# Patient Record
Sex: Female | Born: 1955 | Race: Black or African American | Hispanic: No | Marital: Single | Smoking: Current every day smoker
Health system: Southern US, Community
[De-identification: ages and names within clinical notes are randomized; demographics above are authoritative.]

## PROBLEM LIST (undated history)

## (undated) DIAGNOSIS — I10 Essential (primary) hypertension: Secondary | ICD-10-CM

## (undated) HISTORY — PX: UTERINE FIBROID EMBOLIZATION: SHX825

---

## 2016-04-18 ENCOUNTER — Encounter (HOSPITAL_BASED_OUTPATIENT_CLINIC_OR_DEPARTMENT_OTHER): Payer: Self-pay

## 2016-04-18 ENCOUNTER — Emergency Department (HOSPITAL_BASED_OUTPATIENT_CLINIC_OR_DEPARTMENT_OTHER)
Admission: EM | Admit: 2016-04-18 | Discharge: 2016-04-18 | Disposition: A | Discharge status: Home / Self Care | Payer: Medicaid Other | Attending: Emergency Medicine | Admitting: Emergency Medicine

## 2016-04-18 ENCOUNTER — Emergency Department (HOSPITAL_BASED_OUTPATIENT_CLINIC_OR_DEPARTMENT_OTHER): Payer: Medicaid Other

## 2016-04-18 DIAGNOSIS — Z79899 Other long term (current) drug therapy: Secondary | ICD-10-CM | POA: Insufficient documentation

## 2016-04-18 DIAGNOSIS — F17200 Nicotine dependence, unspecified, uncomplicated: Secondary | ICD-10-CM | POA: Insufficient documentation

## 2016-04-18 DIAGNOSIS — R51 Headache: Secondary | ICD-10-CM | POA: Insufficient documentation

## 2016-04-18 DIAGNOSIS — I10 Essential (primary) hypertension: Secondary | ICD-10-CM | POA: Diagnosis present

## 2016-04-18 HISTORY — DX: Essential (primary) hypertension: I10

## 2016-04-18 LAB — URINALYSIS, ROUTINE W REFLEX MICROSCOPIC
BILIRUBIN URINE: NEGATIVE
GLUCOSE, UA: NEGATIVE mg/dL
KETONES UR: NEGATIVE mg/dL
Nitrite: NEGATIVE
PH: 7 (ref 5.0–8.0)
Protein, ur: NEGATIVE mg/dL
SPECIFIC GRAVITY, URINE: 1.009 (ref 1.005–1.030)

## 2016-04-18 LAB — CBC WITH DIFFERENTIAL/PLATELET
Basophils Absolute: 0 10*3/uL (ref 0.0–0.1)
Basophils Relative: 0 %
Eosinophils Absolute: 0 10*3/uL (ref 0.0–0.7)
Eosinophils Relative: 0 %
HEMATOCRIT: 41.6 % (ref 36.0–46.0)
HEMOGLOBIN: 14.5 g/dL (ref 12.0–15.0)
LYMPHS ABS: 1.8 10*3/uL (ref 0.7–4.0)
LYMPHS PCT: 33 %
MCH: 34.9 pg — AB (ref 26.0–34.0)
MCHC: 34.9 g/dL (ref 30.0–36.0)
MCV: 100.2 fL — AB (ref 78.0–100.0)
MONO ABS: 0.5 10*3/uL (ref 0.1–1.0)
MONOS PCT: 9 %
NEUTROS ABS: 3.1 10*3/uL (ref 1.7–7.7)
NEUTROS PCT: 58 %
Platelets: 240 10*3/uL (ref 150–400)
RBC: 4.15 MIL/uL (ref 3.87–5.11)
RDW: 12.5 % (ref 11.5–15.5)
WBC: 5.4 10*3/uL (ref 4.0–10.5)

## 2016-04-18 LAB — URINE MICROSCOPIC-ADD ON

## 2016-04-18 LAB — BASIC METABOLIC PANEL
Anion gap: 8 (ref 5–15)
BUN: 9 mg/dL (ref 6–20)
CO2: 29 mmol/L (ref 22–32)
Calcium: 9.7 mg/dL (ref 8.9–10.3)
Chloride: 103 mmol/L (ref 101–111)
Creatinine, Ser: 0.73 mg/dL (ref 0.44–1.00)
GFR calc Af Amer: >60 mL/min (ref >60)
GLUCOSE: 116 mg/dL — AB (ref 65–99)
POTASSIUM: 3.5 mmol/L (ref 3.5–5.1)
Sodium: 140 mmol/L (ref 135–145)

## 2016-04-18 LAB — TROPONIN I: Troponin I: <0.03 ng/mL (ref <0.03)

## 2016-04-18 MED ORDER — LISINOPRIL 10 MG PO TABS
20.0000 mg | ORAL_TABLET | Freq: Once | ORAL | Status: AC
  Administered 2016-04-18: 20 mg via ORAL
  Filled 2016-04-18: qty 2

## 2016-04-18 MED ORDER — LISINOPRIL 40 MG PO TABS: 40.0000 mg | ORAL_TABLET | Freq: Every day | ORAL | 0 refills | Status: AC

## 2016-04-18 MED ORDER — HYDROCHLOROTHIAZIDE 25 MG PO TABS: 25.0000 mg | ORAL_TABLET | Freq: Every day | ORAL | 0 refills | Status: AC

## 2016-04-18 MED ORDER — ACETAMINOPHEN 500 MG PO TABS
1000.0000 mg | ORAL_TABLET | Freq: Once | ORAL | Status: AC
  Administered 2016-04-18: 1000 mg via ORAL
  Filled 2016-04-18: qty 2

## 2016-04-18 MED ORDER — HYDRALAZINE HCL 20 MG/ML IJ SOLN: 10.0000 mg | Freq: Once | INTRAMUSCULAR | Status: DC

## 2016-04-18 NOTE — ED Triage Notes (Signed)
C/o elevated BP over the last month-hx of HTN-she was seen by PCP approx one month ago-no changes were made at that time-pt did not f/u-she called to day and was advised to come to ED-NAD

## 2016-04-18 NOTE — Discharge Instructions (Signed)
Increase your lisinopril dose to 40 mg daily. Increase your hydrochlorothiazide dose to 25 mg daily. You may take two of your already prescribed lisinopril 20/12.5 tablets in the mornings. A new prescription for lisinopril 40/hctz 25 to start if you run out of your other medication before you see your doctor again.

## 2016-04-18 NOTE — ED Provider Notes (Signed)
[facility]   CSN: [id number] Arrival date & time: 04/18/16  [id number]  First Provider Contact:   First MD Initiated Contact with Patient 04/18/16 1846      By signing my name below, I, Soijett Blue, attest that this documentation has been prepared under the direction and in the presence of Arby BarretteMarcy Roena Sassaman, MD. Electronically Signed: Soijett Blue, ED Scribe. 04/18/16. 6:55 PM.    History   Chief Complaint Chief Complaint  Patient presents with  . Hypertension    HPI  Misty Contreras is a 60 y.o. female with a PMHx of HTN, who presents to the Emergency Department complaining of HTN. Pt states that [place] 3 [place] prior to the onset of her symptoms. Pt notes that [place] and she was last seen by her PCP one month ago and was informed to follow up for a HTN medication change, to which she didn't. Pt states that she didn't follow up due due to her mother having a procedure being completed in [place]. Pt notes that she called her PCP office today and was informed to come into the ED for further evaluation of her symptoms. Pt reports that her blood pressure is typically 130 as the top number. Pt states that she typically takes lisinopril-HCTZ 20-12.5 mg that she takes in the morning. Pt denies missing any recent doses and she denies taking any other medications for her symptoms.   She states that she is having associated symptoms of sudden onset posterior HA. She states that she has not tried any medications for the relief for her symptoms. She denies vision change, weakness, numbness, tingling, SOB, CP, leg swelling, and any other symptoms. Pt notes that she does smoke 0.5 PPD of cigarettes daily Pt endorses drinking ETOH (wine) ever other day. Denies hx of strokes or recent surgical procedures.    The history is provided by the patient. No language interpreter was used.    Past Medical  History:  Diagnosis Date  . Hypertension     There are no active problems to display for this patient.   Past Surgical History:  Procedure Laterality Date  . UTERINE FIBROID EMBOLIZATION      OB History    No data available       Home Medications    Prior to Admission medications   Medication Sig Start Date End Date Taking? Authorizing Provider  lisinopril-hydrochlorothiazide (PRINZIDE,ZESTORETIC) 20-12.5 MG tablet Take 1 tablet by mouth daily.   Yes Historical Provider, MD  hydrochlorothiazide (HYDRODIURIL) 25 MG tablet Take 1 tablet (25 mg total) by mouth daily. 04/18/16   Arby BarretteMarcy Tripp Goins, MD  lisinopril (PRINIVIL,ZESTRIL) 40 MG tablet Take 1 tablet (40 mg total) by mouth daily. 04/18/16   Arby BarretteMarcy Cyndel Griffey, MD    Family History No family history on file.  Social History Social History  Substance Use Topics  . Smoking status: Current Every Day Smoker  . Smokeless tobacco: Never Used  . Alcohol use Yes     Comment: weekly     Allergies   Review of patient's allergies indicates no known allergies.   Review of Systems Review of Systems A complete 10 system review of systems was obtained and all systems are negative except as noted in the HPI and PMH.    Physical Exam Updated Vital Signs BP 181/92   Pulse 62   Temp 98.2 F (36.8 C) (Oral)  Resp 19   Ht  (1.727 m)   Wt 125 lb (56.7 kg)   SpO2 99%   BMI 19.01 kg/m   Physical Exam  Constitutional: She is oriented to person, place, and time. She appears well-developed and well-nourished. No distress.  HENT:  Head: Normocephalic and atraumatic.  Mouth/Throat: Oropharynx is clear and moist. No oropharyngeal exudate.  Eyes: EOM are normal. Pupils are equal, round, and reactive to light.  Neck: Normal range of motion. Neck supple. No thyromegaly present.  Cardiovascular: Normal rate, regular rhythm and normal heart sounds.  Exam reveals no gallop and no friction rub.   No murmur heard. Pulmonary/Chest:  Effort normal and breath sounds normal. No respiratory distress. She has no wheezes. She has no rales.  Abdominal: She exhibits no distension.  Musculoskeletal: Normal range of motion.  Neurological: She is alert and oriented to person, place, and time.  Skin: Skin is warm and dry.  Psychiatric: She has a normal mood and affect. Her behavior is normal.  Nursing note and vitals reviewed.    ED Treatments / Results  DIAGNOSTIC STUDIES: Oxygen Saturation is 100% on RA, nl by my interpretation.    COORDINATION OF CARE: 6:55 PM Discussed treatment plan with pt at bedside which includes labs, UA, and pt agreed to plan.   Labs (all labs ordered are listed, but only abnormal results are displayed) Labs Reviewed  BASIC METABOLIC PANEL - Abnormal; Notable for the following:       Result Value   Glucose, Bld 116 (*)    All other components within normal limits  CBC WITH DIFFERENTIAL/PLATELET - Abnormal; Notable for the following:    MCV 100.2 (*)    MCH 34.9 (*)    All other components within normal limits  TROPONIN I  URINALYSIS, ROUTINE W REFLEX MICROSCOPIC (NOT AT [facility])    EKG  EKG Interpretation  Date/Time:  Wednesday April 18 2016 19:07:19 EDT Ventricular Rate:  67 PR Interval:    QRS Duration: 95 QT Interval:  429 QTC Calculation: 453 R Axis:   -52 Text Interpretation:  Sinus rhythm Probable left atrial enlargement Left anterior fascicular block RSR' in V1 or V2, probably normal variant Consider anterior infarct agree. no STEMI Confirmed by Donnald Garre, MD, Lebron Conners [phone]) on 04/18/2016 7:43:29 PM       Radiology Ct Head Wo Contrast  Result Date: 04/18/2016 CLINICAL DATA:  60 year old female with acute headache and hypertension. EXAM: CT HEAD WITHOUT CONTRAST TECHNIQUE: Contiguous axial images were obtained from the base of the skull through the vertex without intravenous contrast. COMPARISON:  None. FINDINGS: Mild generalized cerebral volume loss noted. No acute intracranial  abnormalities are identified, including mass lesion or mass effect, hydrocephalus, extra-axial fluid collection, midline shift, hemorrhage, or acute infarction. The visualized bony calvarium is unremarkable. IMPRESSION: No evidence of acute intracranial abnormality. Electronically Signed   By: Harmon Pier M.D.   On: 04/18/2016 19:25    Procedures Procedures (including critical care time)  Medications Ordered in ED Medications  hydrALAZINE (APRESOLINE) injection 10 mg (not administered)  lisinopril (PRINIVIL,ZESTRIL) tablet 20 mg (20 mg Oral Given 04/18/16 1920)  acetaminophen (TYLENOL) tablet 1,000 mg (1,000 mg Oral Given 04/18/16 1920)     Initial Impression / Assessment and Plan / ED Course  I have reviewed the triage vital signs and the nursing notes.  Pertinent labs & imaging results that were available during my care of the patient were reviewed by me and considered in my medical decision making (see  chart for details).  Clinical Course    Final Clinical Impressions(s) / ED Diagnoses   Final diagnoses:  Essential hypertension  Patient's headache had nearly resolved at arrival to the emergency department. There are no associated signs of encephalopathy. No neurologic dysfunction. No chest pain. CT head is negative and diagnostic workup otherwise within normal limits. At this time I do feel patient is safe for continued outpatient management. Her lisinopril dose will be increased to 40 mg daily and her hydrochlorothiazide dose to 25 mg daily. Patient is encouraged have close follow-up with her PCP but she is also advised to return to the emergency department if she should develop any concerning symptoms.  New Prescriptions New Prescriptions   HYDROCHLOROTHIAZIDE (HYDRODIURIL) 25 MG TABLET    Take 1 tablet (25 mg total) by mouth daily.   LISINOPRIL (PRINIVIL,ZESTRIL) 40 MG TABLET    Take 1 tablet (40 mg total) by mouth daily.       Arby Barrette, MD 04/18/16 [phone]

## 2016-04-18 NOTE — ED Notes (Signed)
Pt states she's been feeling weak and checked her BP and it was high.  Has been taking her BP meds and states her dr is trying to change it but having a hard time to get in to see him.

## 2016-04-18 NOTE — ED Notes (Signed)
Patient transported to CT 

## 2016-04-19 ENCOUNTER — Encounter (HOSPITAL_BASED_OUTPATIENT_CLINIC_OR_DEPARTMENT_OTHER): Payer: Self-pay | Admitting: Emergency Medicine

## 2016-04-19 ENCOUNTER — Emergency Department (HOSPITAL_BASED_OUTPATIENT_CLINIC_OR_DEPARTMENT_OTHER)
Admission: EM | Admit: 2016-04-19 | Discharge: 2016-04-19 | Disposition: A | Discharge status: Home / Self Care | Payer: Medicaid Other | Attending: Emergency Medicine | Admitting: Emergency Medicine

## 2016-04-19 DIAGNOSIS — F17210 Nicotine dependence, cigarettes, uncomplicated: Secondary | ICD-10-CM | POA: Insufficient documentation

## 2016-04-19 DIAGNOSIS — Z79899 Other long term (current) drug therapy: Secondary | ICD-10-CM | POA: Diagnosis not present

## 2016-04-19 DIAGNOSIS — I10 Essential (primary) hypertension: Secondary | ICD-10-CM | POA: Diagnosis present

## 2016-04-19 NOTE — ED Provider Notes (Signed)
[facility]   CSN: [id number] Arrival date & time: 04/19/16  0202  First Provider Contact:  None       History   Chief Complaint Chief Complaint  Patient presents with  . Hypertension    HPI Misty Contreras is a 60 y.o. female with a history of hypertension. She was seen here yesterday after becoming concerned about her blood pressure. Workup was unremarkable apart from moderately elevated blood pressure and she was prescribed increased doses of her lisinopril and hydrochlorothiazide. She returns because she took her blood pressure at home this morning and it was 204/120. She became very anxious and called 911. She had a mild headache initially but this has resolved. She denies chest pain or shortness of breath.  HPI  Past Medical History:  Diagnosis Date  . Hypertension     There are no active problems to display for this patient.   Past Surgical History:  Procedure Laterality Date  . UTERINE FIBROID EMBOLIZATION      OB History    No data available       Home Medications    Prior to Admission medications   Medication Sig Start Date End Date Taking? Authorizing Provider  hydrochlorothiazide (HYDRODIURIL) 25 MG tablet Take 1 tablet (25 mg total) by mouth daily. 04/18/16   Arby Barrette, MD  lisinopril (PRINIVIL,ZESTRIL) 40 MG tablet Take 1 tablet (40 mg total) by mouth daily. 04/18/16   Arby Barrette, MD  lisinopril-hydrochlorothiazide (PRINZIDE,ZESTORETIC) 20-12.5 MG tablet Take 1 tablet by mouth daily.    Historical Provider, MD    Family History History reviewed. No pertinent family history.  Social History Social History  Substance Use Topics  . Smoking status: Current Every Day Smoker  . Smokeless tobacco: Never Used  . Alcohol use Yes     Comment: weekly     Allergies   Review of patient's allergies indicates no known allergies.   Review of Systems Review of Systems  All other systems reviewed and are  negative.    Physical Exam Updated Vital Signs BP 183/94   Pulse (!) 58   Temp 98.2 F (36.8 C) (Oral)   Resp 18   SpO2 100%   Physical Exam General: Well-developed, well-nourished female in no acute distress; appearance consistent with age of record HENT: normocephalic; atraumatic Eyes: pupils equal, round and reactive to light; extraocular muscles intact; arcus senilis bilaterally Neck: supple Heart: regular rate and rhythm; no murmurs, rubs or gallops Lungs: clear to auscultation bilaterally Abdomen: soft; nondistended; nontender; no masses or hepatosplenomegaly; bowel sounds present Extremities: No deformity; full range of motion; pulses normal Neurologic: Awake, alert and oriented; motor function intact in all extremities and symmetric; no facial droop; normal speech; normal gait; normal coordination Skin: Warm and dry Psychiatric: Normal mood and affect    ED Treatments / Results   Nursing notes and vitals signs, including pulse oximetry, reviewed.  Summary of this visit's results, reviewed by myself:  Labs:  Results for orders placed or performed during the hospital encounter of 04/18/16 (from the past 24 hour(s))  Basic metabolic panel     Status: Abnormal   Collection Time: 04/18/16  7:08 PM  Result Value Ref Range   Sodium 140 135 - 145 mmol/L   Potassium 3.5 3.5 - 5.1 mmol/L   Chloride 103 101 - 111 mmol/L   CO2 29 22 - 32 mmol/L   Glucose, Bld 116 (H) 65 - 99 mg/dL   BUN 9 6 - 20  mg/dL   Creatinine, Ser 1.61 0.44 - 1.00 mg/dL   Calcium 9.7 8.9 - 09.6 mg/dL   GFR calc non Af Amer >60 >60 mL/min   GFR calc Af Amer >60 >60 mL/min   Anion gap 8 5 - 15  Troponin I     Status: None   Collection Time: 04/18/16  7:08 PM  Result Value Ref Range   Troponin I <0.03 <0.03 ng/mL  CBC with Differential     Status: Abnormal   Collection Time: 04/18/16  7:08 PM  Result Value Ref Range   WBC 5.4 4.0 - 10.5 K/uL   RBC 4.15 3.87 - 5.11 MIL/uL   Hemoglobin 14.5 12.0  - 15.0 g/dL   HCT 04.5 40.9 - 81.1 %   MCV 100.2 (H) 78.0 - 100.0 fL   MCH 34.9 (H) 26.0 - 34.0 pg   MCHC 34.9 30.0 - 36.0 g/dL   RDW 91.4 78.2 - 95.6 %   Platelets 240 150 - 400 K/uL   Neutrophils Relative % 58 %   Neutro Abs 3.1 1.7 - 7.7 K/uL   Lymphocytes Relative 33 %   Lymphs Abs 1.8 0.7 - 4.0 K/uL   Monocytes Relative 9 %   Monocytes Absolute 0.5 0.1 - 1.0 K/uL   Eosinophils Relative 0 %   Eosinophils Absolute 0.0 0.0 - 0.7 K/uL   Basophils Relative 0 %   Basophils Absolute 0.0 0.0 - 0.1 K/uL  Urinalysis, Routine w reflex microscopic     Status: Abnormal   Collection Time: 04/18/16  8:40 PM  Result Value Ref Range   Color, Urine YELLOW YELLOW   APPearance CLEAR CLEAR   Specific Gravity, Urine 1.009 1.005 - 1.030   pH 7.0 5.0 - 8.0   Glucose, UA NEGATIVE NEGATIVE mg/dL   Hgb urine dipstick MODERATE (A) NEGATIVE   Bilirubin Urine NEGATIVE NEGATIVE   Ketones, ur NEGATIVE NEGATIVE mg/dL   Protein, ur NEGATIVE NEGATIVE mg/dL   Nitrite NEGATIVE NEGATIVE   Leukocytes, UA MODERATE (A) NEGATIVE  Urine microscopic-add on     Status: Abnormal   Collection Time: 04/18/16  8:40 PM  Result Value Ref Range   Squamous Epithelial / LPF 0-5 (A) NONE SEEN   WBC, UA 0-5 0 - 5 WBC/hpf   RBC / HPF 0-5 0 - 5 RBC/hpf   Bacteria, UA FEW (A) NONE SEEN    Imaging Studies: Ct Head Wo Contrast  Result Date: 04/18/2016 CLINICAL DATA:  60 year old female with acute headache and hypertension. EXAM: CT HEAD WITHOUT CONTRAST TECHNIQUE: Contiguous axial images were obtained from the base of the skull through the vertex without intravenous contrast. COMPARISON:  None. FINDINGS: Mild generalized cerebral volume loss noted. No acute intracranial abnormalities are identified, including mass lesion or mass effect, hydrocephalus, extra-axial fluid collection, midline shift, hemorrhage, or acute infarction. The visualized bony calvarium is unremarkable. IMPRESSION: No evidence of acute intracranial  abnormality. Electronically Signed   By: Harmon Pier M.D.   On: 04/18/2016 19:25    Procedures (including critical care time)  Final Clinical Impressions(s) / ED Diagnoses  The patient was advised that her blood pressure has improved and was 178/94 on arrival. There is no indication for emergent intervention she has no evidence of end organ damage. She was advised that worrying about her blood pressure can in fact raise her blood pressure and can become a vicious cycle. She was advised to take her blood pressure at a given time each day and keep a log.  Final diagnoses:  Hypertension not at goal      Paula LibraJohn Percy Winterrowd, MD 04/19/16 [phone]

## 2016-04-19 NOTE — ED Notes (Signed)
Pt reports mild numbness left upper are denies radiation denies numbness elsewhere.

## 2016-04-19 NOTE — ED Notes (Signed)
MD at bedside. 

## 2016-04-30 ENCOUNTER — Encounter (HOSPITAL_BASED_OUTPATIENT_CLINIC_OR_DEPARTMENT_OTHER): Payer: Self-pay | Admitting: *Deleted

## 2016-04-30 ENCOUNTER — Emergency Department (HOSPITAL_BASED_OUTPATIENT_CLINIC_OR_DEPARTMENT_OTHER)
Admission: EM | Admit: 2016-04-30 | Discharge: 2016-05-01 | Disposition: A | Discharge status: Home / Self Care | Payer: Medicaid Other | Attending: Emergency Medicine | Admitting: Emergency Medicine

## 2016-04-30 DIAGNOSIS — F17200 Nicotine dependence, unspecified, uncomplicated: Secondary | ICD-10-CM | POA: Insufficient documentation

## 2016-04-30 DIAGNOSIS — N898 Other specified noninflammatory disorders of vagina: Secondary | ICD-10-CM

## 2016-04-30 DIAGNOSIS — H9209 Otalgia, unspecified ear: Secondary | ICD-10-CM | POA: Insufficient documentation

## 2016-04-30 DIAGNOSIS — I10 Essential (primary) hypertension: Secondary | ICD-10-CM | POA: Diagnosis not present

## 2016-04-30 DIAGNOSIS — Z711 Person with feared health complaint in whom no diagnosis is made: Secondary | ICD-10-CM

## 2016-04-30 DIAGNOSIS — J309 Allergic rhinitis, unspecified: Secondary | ICD-10-CM | POA: Diagnosis not present

## 2016-04-30 DIAGNOSIS — Z79899 Other long term (current) drug therapy: Secondary | ICD-10-CM | POA: Diagnosis not present

## 2016-04-30 LAB — URINE MICROSCOPIC-ADD ON

## 2016-04-30 LAB — WET PREP, GENITAL
CLUE CELLS WET PREP: NONE SEEN
Sperm: NONE SEEN
Trich, Wet Prep: NONE SEEN
YEAST WET PREP: NONE SEEN

## 2016-04-30 LAB — URINALYSIS, ROUTINE W REFLEX MICROSCOPIC
Bilirubin Urine: NEGATIVE
Glucose, UA: NEGATIVE mg/dL
Ketones, ur: NEGATIVE mg/dL
Nitrite: NEGATIVE
PROTEIN: NEGATIVE mg/dL
Specific Gravity, Urine: 1.003 — ABNORMAL LOW (ref 1.005–1.030)
pH: 7 (ref 5.0–8.0)

## 2016-04-30 NOTE — ED Notes (Signed)
Pelvic cart outside pts room. 

## 2016-04-30 NOTE — ED Triage Notes (Signed)
Pt c/o vaginal discharge x 1 week.  

## 2016-04-30 NOTE — ED Provider Notes (Signed)
[facility] Provider Note   CSN: [id number] Arrival date & time: 04/30/16  2237  By signing my name below, I, Vista Minkobert Ross, attest that this documentation has been prepared under the direction and in the presence of Will Vlasta Baskin PA-C.  Electronically Signed: Vista Minkobert Ross, ED Scribe. 04/30/16. 11:27 PM.  History   Chief Complaint Chief Complaint  Patient presents with  . Vaginal Discharge    HPI HPI Comments: Misty Contreras is a 60 y.o. female who presents to the Emergency Department complaining of white vaginal discharge that has been persistent for the past week. Pt is here visiting her mother and states that she called her PCP who told her to use Flagyl cream, but reports no relief. Pt describes the discharge as "cottage cheese". Pt was last sexually active 3 years ago. Patient also complains of nasal congestion, sneezing, sinus headache and some ear pressure. Pt does not take birth control. Pt no longer has menstrual cycles. She denies any fever, nausea, vomiting, diarrhea, vaginal bleeding, increased urinary frequency, urgency or dysuria.   The history is provided by the patient. No language interpreter was used.    Past Medical History:  Diagnosis Date  . Hypertension     There are no active problems to display for this patient.   Past Surgical History:  Procedure Laterality Date  . UTERINE FIBROID EMBOLIZATION      OB History    No data available       Home Medications    Prior to Admission medications   Medication Sig Start Date End Date Taking? Authorizing Provider  cetirizine (ZYRTEC ALLERGY) 10 MG tablet Take 1 tablet (10 mg total) by mouth daily. 05/01/16   Everlene FarrierWilliam Chimene Salo, PA-C  fluticasone (FLONASE) 50 MCG/ACT nasal spray Place 2 sprays into both nostrils daily. 05/01/16   Everlene FarrierWilliam Usama Harkless, PA-C  hydrochlorothiazide (HYDRODIURIL) 25 MG tablet Take 1 tablet (25 mg total) by mouth daily. 04/18/16   Arby BarretteMarcy Pfeiffer, MD  lisinopril (PRINIVIL,ZESTRIL) 40  MG tablet Take 1 tablet (40 mg total) by mouth daily. 04/18/16   Arby BarretteMarcy Pfeiffer, MD  lisinopril-hydrochlorothiazide (PRINZIDE,ZESTORETIC) 20-12.5 MG tablet Take 1 tablet by mouth daily.    Historical Provider, MD  metroNIDAZOLE (FLAGYL) 500 MG tablet Take 1 tablet (500 mg total) by mouth 2 (two) times daily. 05/01/16   Everlene FarrierWilliam Merin Borjon, PA-C    Family History History reviewed. No pertinent family history.  Social History Social History  Substance Use Topics  . Smoking status: Current Every Day Smoker  . Smokeless tobacco: Never Used  . Alcohol use Yes     Comment: weekly     Allergies   Review of patient's allergies indicates no known allergies.   Review of Systems Review of Systems  Constitutional: Negative for chills and fever.  HENT: Positive for ear pain, rhinorrhea and sinus pressure. Negative for congestion and sore throat.   Eyes: Negative for visual disturbance.  Respiratory: Negative for cough, shortness of breath and wheezing.   Cardiovascular: Negative for chest pain and palpitations.  Gastrointestinal: Negative for abdominal pain, diarrhea, nausea and vomiting.  Genitourinary: Positive for vaginal discharge (white). Negative for decreased urine volume, dysuria, frequency, menstrual problem, pelvic pain, urgency, vaginal bleeding and vaginal pain.  Musculoskeletal: Negative for back pain and neck pain.  Skin: Negative for rash.  Neurological: Negative for headaches.     Physical Exam Updated Vital Signs BP 162/89 (BP Location: Left Arm)   Pulse 63   Temp 98.2 F (36.8 C) (Oral)   Resp  18   Ht 5\' 8"  (1.727 m)   Wt 56.7 kg   SpO2 100%   BMI 19.01 kg/m   Physical Exam  Constitutional: She appears well-developed and well-nourished. No distress.  Nontoxic appearing.  HENT:  Head: Normocephalic and atraumatic.  Right Ear: External ear normal.  Left Ear: External ear normal.  Mouth/Throat: Oropharynx is clear and moist. No oropharyngeal exudate.  Boggy nasal  turbinates bilaterally Mild middle ear effusion bilaterally. No TM erythema or loss of landmarks. Throat is clear. No tonsillar hypertrophy or exudates.  Eyes: Conjunctivae are normal. Pupils are equal, round, and reactive to light. Right eye exhibits no discharge. Left eye exhibits no discharge.  Neck: Neck supple. No JVD present.  Cardiovascular: Normal rate, regular rhythm, normal heart sounds and intact distal pulses.   Pulmonary/Chest: Effort normal and breath sounds normal. No stridor. No respiratory distress. She has no wheezes.  Abdominal: Soft. There is no tenderness. There is no guarding.  Abdomen is soft and nontender to palpation.  Genitourinary: Vaginal discharge found.  Genitourinary Comments: Pelvic exam performed by me with female nurse as chaperone. No external lesions or rashes noted. Patient with white vaginal discharge. Cervix is closed. No cervical motion tenderness. No adnexal tenderness or fullness. No vaginal bleeding.  Lymphadenopathy:    She has no cervical adenopathy.  Neurological: She is alert. Coordination normal.  Skin: Skin is warm and dry. Capillary refill takes less than 2 seconds. No rash noted. She is not diaphoretic. No erythema. No pallor.  Psychiatric: She has a normal mood and affect. Her behavior is normal.  Nursing note and vitals reviewed.    ED Treatments / Results  DIAGNOSTIC STUDIES: Oxygen Saturation is 100% on RA, normal by my interpretation.  COORDINATION OF CARE: 11:21 PM-Will do pelvic exam. Discussed treatment plan with pt at bedside and pt agreed to plan.   Labs (all labs ordered are listed, but only abnormal results are displayed) Labs Reviewed  WET PREP, GENITAL - Abnormal; Notable for the following:       Result Value   WBC, Wet Prep HPF POC MODERATE (*)    All other components within normal limits  URINALYSIS, ROUTINE W REFLEX MICROSCOPIC (NOT AT [facility]) - Abnormal; Notable for the following:    Specific Gravity, Urine 1.003  (*)    Hgb urine dipstick MODERATE (*)    Leukocytes, UA MODERATE (*)    All other components within normal limits  URINE MICROSCOPIC-ADD ON - Abnormal; Notable for the following:    Squamous Epithelial / LPF 0-5 (*)    Bacteria, UA FEW (*)    All other components within normal limits  GC/CHLAMYDIA PROBE AMP ([place]) NOT AT [facility]    EKG  EKG Interpretation None       Radiology No results found.  Procedures Procedures (including critical care time)  Medications Ordered in ED Medications  cefTRIAXone (ROCEPHIN) injection 250 mg (250 mg Intramuscular Given 05/01/16 0021)  azithromycin (ZITHROMAX) tablet 1,000 mg (1,000 mg Oral Given 05/01/16 0022)  fluconazole (DIFLUCAN) tablet 150 mg (150 mg Oral Given 05/01/16 0021)     Initial Impression / Assessment and Plan / ED Course  I have reviewed the triage vital signs and the nursing notes.  Pertinent labs & imaging results that were available during my care of the patient were reviewed by me and considered in my medical decision making (see chart for details).  Clinical Course   Patient presented with vaginal discharge for 1 week. No abdominal  pelvic pain. On exam she is afebrile and nontoxic appearing. Her abdomen is soft and nontender. She is white vaginal discharge on exam. No cervical motion tenderness. No adnexal tenderness or fullness. Wet prep returned with many white blood cells. Patient reports she's not been sexually active for more than 3 years. Will still cover with Rocephin and azithromycin. Patient is also requesting Diflucan as she reports this helps her symptoms. Will provide her with Diflucan. I advised if her symptoms persist she can use Flagyl which I provided her prescription for discharge. I advised the patient to follow-up with their primary care provider this week. I advised the patient to return to the emergency department with new or worsening symptoms or new concerns. The patient verbalized understanding and  agreement with plan.    Final Clinical Impressions(s) / ED Diagnoses   Final diagnoses:  Vaginal discharge  Concern about STD in female without diagnosis  Allergic rhinitis, unspecified allergic rhinitis type    New Prescriptions Discharge Medication List as of 05/01/2016 12:23 AM    START taking these medications   Details  metroNIDAZOLE (FLAGYL) 500 MG tablet Take 1 tablet (500 mg total) by mouth 2 (two) times daily., Starting Tue 05/01/2016, Print       I personally performed the services described in this documentation, which was scribed in my presence. The recorded information has been reviewed and is accurate.       Everlene Farrier, PA-C 05/01/16 0041    Azalia Bilis, MD 05/02/16 [phone]

## 2016-05-01 MED ORDER — METRONIDAZOLE 500 MG PO TABS: 500.0000 mg | ORAL_TABLET | Freq: Two times a day (BID) | ORAL | 0 refills | Status: AC

## 2016-05-01 MED ORDER — CETIRIZINE HCL 10 MG PO TABS: 10.0000 mg | ORAL_TABLET | Freq: Every day | ORAL | 1 refills | Status: AC

## 2016-05-01 MED ORDER — AZITHROMYCIN 250 MG PO TABS
1000.0000 mg | ORAL_TABLET | Freq: Once | ORAL | Status: AC
  Administered 2016-05-01: 1000 mg via ORAL
  Filled 2016-05-01: qty 4

## 2016-05-01 MED ORDER — CEFTRIAXONE SODIUM 250 MG IJ SOLR
250.0000 mg | Freq: Once | INTRAMUSCULAR | Status: AC
  Administered 2016-05-01: 250 mg via INTRAMUSCULAR
  Filled 2016-05-01: qty 250

## 2016-05-01 MED ORDER — FLUCONAZOLE 100 MG PO TABS
150.0000 mg | ORAL_TABLET | Freq: Once | ORAL | Status: AC
  Administered 2016-05-01: 150 mg via ORAL
  Filled 2016-05-01: qty 1

## 2016-05-01 MED ORDER — FLUTICASONE PROPIONATE 50 MCG/ACT NA SUSP: 2.0000 | Freq: Every day | NASAL | 0 refills | Status: AC

## 2016-05-02 LAB — GC/CHLAMYDIA PROBE AMP (~~LOC~~) NOT AT ARMC
Chlamydia: NEGATIVE
NEISSERIA GONORRHEA: NEGATIVE

## 2016-07-03 ENCOUNTER — Encounter (HOSPITAL_BASED_OUTPATIENT_CLINIC_OR_DEPARTMENT_OTHER): Payer: Self-pay | Admitting: Emergency Medicine

## 2016-07-03 ENCOUNTER — Emergency Department (HOSPITAL_BASED_OUTPATIENT_CLINIC_OR_DEPARTMENT_OTHER)
Admission: EM | Admit: 2016-07-03 | Discharge: 2016-07-03 | Disposition: A | Discharge status: Home / Self Care | Payer: Medicaid Other | Attending: Emergency Medicine | Admitting: Emergency Medicine

## 2016-07-03 DIAGNOSIS — N898 Other specified noninflammatory disorders of vagina: Secondary | ICD-10-CM | POA: Insufficient documentation

## 2016-07-03 DIAGNOSIS — Z79899 Other long term (current) drug therapy: Secondary | ICD-10-CM | POA: Insufficient documentation

## 2016-07-03 DIAGNOSIS — I10 Essential (primary) hypertension: Secondary | ICD-10-CM | POA: Insufficient documentation

## 2016-07-03 DIAGNOSIS — F17200 Nicotine dependence, unspecified, uncomplicated: Secondary | ICD-10-CM | POA: Insufficient documentation

## 2016-07-03 LAB — URINALYSIS, ROUTINE W REFLEX MICROSCOPIC
Bilirubin Urine: NEGATIVE
Glucose, UA: NEGATIVE mg/dL
Ketones, ur: NEGATIVE mg/dL
NITRITE: NEGATIVE
PH: 6.5 (ref 5.0–8.0)
Protein, ur: NEGATIVE mg/dL
SPECIFIC GRAVITY, URINE: 1.016 (ref 1.005–1.030)

## 2016-07-03 LAB — WET PREP, GENITAL
Clue Cells Wet Prep HPF POC: NONE SEEN
Sperm: NONE SEEN
Trich, Wet Prep: NONE SEEN
YEAST WET PREP: NONE SEEN

## 2016-07-03 LAB — URINE MICROSCOPIC-ADD ON

## 2016-07-03 MED ORDER — FLUCONAZOLE 50 MG PO TABS: 150.0000 mg | ORAL_TABLET | Freq: Once | ORAL | Status: DC

## 2016-07-03 NOTE — ED Triage Notes (Signed)
Reports vaginal discharge x 10 days, states she has recurring vaginal infections.

## 2016-07-03 NOTE — ED Provider Notes (Signed)
[facility] Note   CSN: [id number] Arrival date & time: 07/03/16  1542  By signing my name below, I, Soijett Blue, attest that this documentation has been prepared under the direction and in the presence of Shawn Joy, PA-C Electronically Signed: Soijett Blue, ED Scribe. 07/03/16. 5:04 PM.   History   Chief Complaint Chief Complaint  Patient presents with  . Vaginal Discharge    HPI Misty Contreras is a 60 y.o. female with a PMHx of HTN, who presents to the Emergency Department complaining of malodorous vaginal discharge onset 10 days. Pt reports that she has had recurring vaginal discharge for several years. Pt notes that she hasn't been sexually active x 3-4 years. 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 She states that she has not tried any medications for the relief of her symptoms. She denies vaginal discomfort, abdominal pain, fever/chills, urinary complaints, nausea/vomiting, or any other complaints.     Per pt chart review: Pt was seen in the ED on 04/30/2016 for vaginal discharge. Pt was given prophylactic rocephin and zithromax and was Rx flagyl and diflucan for her symptoms.   The history is provided by the patient. No language interpreter was used.    Past Medical History:  Diagnosis Date  . Hypertension     There are no active problems to display for this patient.   Past Surgical History:  Procedure Laterality Date  . UTERINE FIBROID EMBOLIZATION      OB History    No data available       Home Medications    Prior to Admission medications   Medication Sig Start Date End Date Taking? Authorizing Provider  cetirizine (ZYRTEC ALLERGY) 10 MG tablet Take 1 tablet (10 mg total) by mouth daily. 05/01/16   Everlene Farrier, PA-C  fluticasone (FLONASE) 50 MCG/ACT nasal spray Place 2 sprays into both nostrils daily. 05/01/16   Everlene Farrier, PA-C  hydrochlorothiazide  (HYDRODIURIL) 25 MG tablet Take 1 tablet (25 mg total) by mouth daily. 04/18/16   Arby Barrette, MD  lisinopril (PRINIVIL,ZESTRIL) 40 MG tablet Take 1 tablet (40 mg total) by mouth daily. 04/18/16   Arby Barrette, MD  lisinopril-hydrochlorothiazide (PRINZIDE,ZESTORETIC) 20-12.5 MG tablet Take 1 tablet by mouth daily.    Historical Provider, MD  metroNIDAZOLE (FLAGYL) 500 MG tablet Take 1 tablet (500 mg total) by mouth 2 (two) times daily. 05/01/16   Everlene Farrier, PA-C    Family History No family history on file.  Social History Social History  Substance Use Topics  . Smoking status: Current Every Day Smoker  . Smokeless tobacco: Never Used  . Alcohol use Yes     Comment: weekly     Allergies   Review of patient's allergies indicates no known allergies.   Review of Systems Review of Systems  Constitutional: Negative for chills and fever.  Gastrointestinal: Negative for abdominal pain, nausea and vomiting.  Genitourinary: Positive for vaginal discharge (malodorous). Negative for dysuria, hematuria, pelvic pain and vaginal pain.  All other systems reviewed and are negative.   Physical Exam Updated Vital Signs BP 118/67 (BP Location: Right Arm)   Pulse 68   Temp 98.1 F (36.7 C) (Oral)   Resp 16   Ht 5\' 8"  (1.727 m)   Wt 124 lb (56.2 kg)   SpO2 100%   BMI 18.85 kg/m   Physical Exam  Constitutional: She appears well-developed and well-nourished. No distress.  HENT:  Head: Normocephalic and atraumatic.  Eyes: Conjunctivae are normal.  Neck: Neck supple.  Cardiovascular: Normal rate, regular rhythm, normal heart sounds and intact distal pulses.   Pulmonary/Chest: Effort normal and breath sounds normal. No respiratory distress. She has no wheezes. She has no rales.  Abdominal: Soft. She exhibits no distension. There is no tenderness. There is no guarding.  Genitourinary:  Genitourinary Comments: External genitalia normal Vagina with discharge - Scant, thick, white  discharge Cervix  normal negative for cervical motion tenderness Adnexa palpated, no masses, negative for tenderness noted Bladder palpated negative for tenderness Uterus palpated no masses, negative for tenderness  Otherwise normal female genitalia. [place], [place], served as Biomedical engineer during exam.  Musculoskeletal: She exhibits no edema.  Lymphadenopathy:    She has no cervical adenopathy.       Right: No inguinal adenopathy present.       Left: No inguinal adenopathy present.  Neurological: She is alert.  Skin: Skin is warm and dry. She is not diaphoretic.  Psychiatric: She has a normal mood and affect. Her behavior is normal.  Nursing note and vitals reviewed.    ED Treatments / Results  DIAGNOSTIC STUDIES: Oxygen Saturation is 100% on RA, nl by my interpretation.    COORDINATION OF CARE: 5:02 PM Discussed treatment plan with pt at bedside which includes UA, wet prep, pelvic exam, and pt agreed to plan.   Labs (all labs ordered are listed, but only abnormal results are displayed) Labs Reviewed  WET PREP, GENITAL - Abnormal; Notable for the following:       Result Value   WBC, Wet Prep HPF POC MANY (*)    All other components within normal limits  URINALYSIS, ROUTINE W REFLEX MICROSCOPIC (NOT AT [facility]) - Abnormal; Notable for the following:    Hgb urine dipstick MODERATE (*)    Leukocytes, UA MODERATE (*)    All other components within normal limits  URINE MICROSCOPIC-ADD ON - Abnormal; Notable for the following:    Squamous Epithelial / LPF 0-5 (*)    Bacteria, UA RARE (*)    All other components within normal limits    Procedures Procedures (including critical care time)  Medications Ordered in ED Medications - No data to display   Initial Impression / Assessment and Plan / ED Course  I have reviewed the triage vital signs and the nursing notes.  Pertinent labs that were available during my care of the patient were reviewed by me and considered in my medical  decision making (see chart for details).  Clinical Course    Patient presents with complaint of recurrent abnormal vaginal discharge. No significant abnormalities on pelvic exam. No definitive findings on wet prep. Questions about proper female hygiene discussed. Patient advised to follow-up with OB/GYN.  Vitals:   07/03/16 1603 07/03/16 1604 07/03/16 1807  BP: 118/67  114/71  Pulse: 68  65  Resp: 16  18  Temp: 98.1 F (36.7 C)    TempSrc: Oral    SpO2: 100%  99%  Weight:  56.2 kg   Height:  5\' 8"  (1.727 m)      Final Clinical Impressions(s) / ED Diagnoses   Final diagnoses:  Vaginal discharge    New Prescriptions Discharge Medication List as of 07/03/2016  5:57 PM     I personally performed the services described in this documentation, which was scribed in my presence. The recorded information has been reviewed and is accurate.     Anselm Pancoast,  PA-C 07/04/16 [phone]    Geoffery Lyonsouglas Delo, MD 07/05/16 1424

## 2016-07-03 NOTE — Discharge Instructions (Signed)
You have been seen today for vaginal discharge. There were no signs of a specific infection on the wet prep. Follow up with women's clinic should symptoms fail to resolve. You may try a probiotic with Lactobacillus bacteria support to support healthy vaginal flora.

## 2016-07-03 NOTE — ED Notes (Signed)
Patient denies pain and is resting comfortably.  

## 2016-07-03 NOTE — ED Notes (Signed)
Called for triage x1-no answer. 

## 2017-04-18 IMAGING — CT CT HEAD W/O CM
3 series · 14 of 46 positions shown, 16 images · non-contrast
Comparison: None.

CLINICAL DATA: 60-year-old female with acute headache and
hypertension.

EXAM:
CT HEAD WITHOUT CONTRAST
TECHNIQUE: Contiguous axial images were obtained from the base of the skull
through the vertex without intravenous contrast.

[Series 2: head wo · axial · 0.41mm/px · z∈[+1036,+1156]mm · 8 of 29 slices shown, 10 images]
[im 3/29  brain]
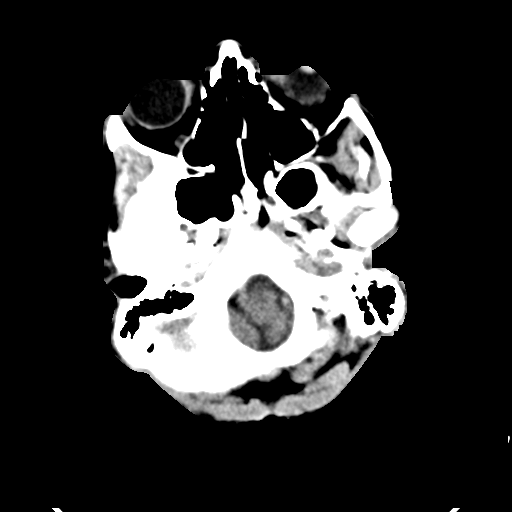
[im 3/29  bone]
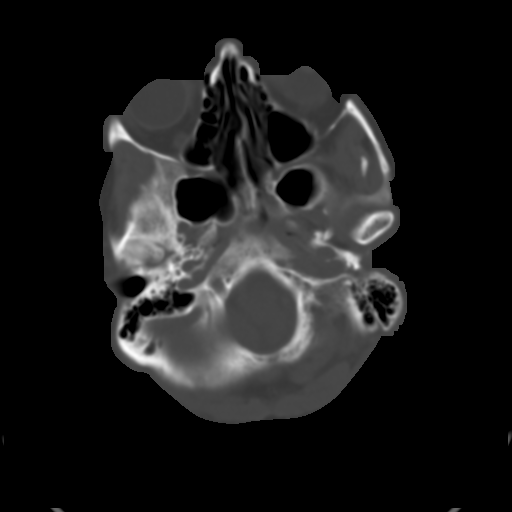
[im 7/29  brain]
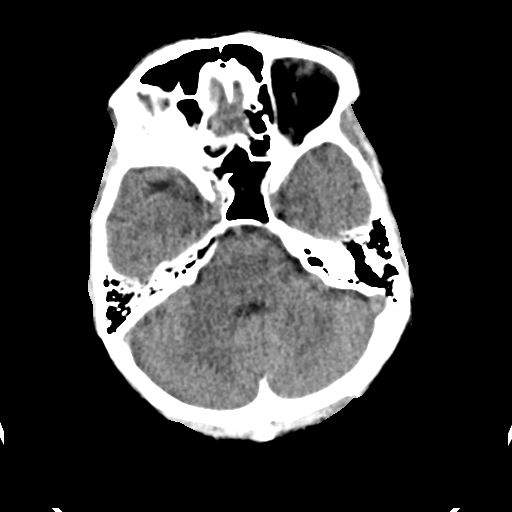
[im 10/29  brain]
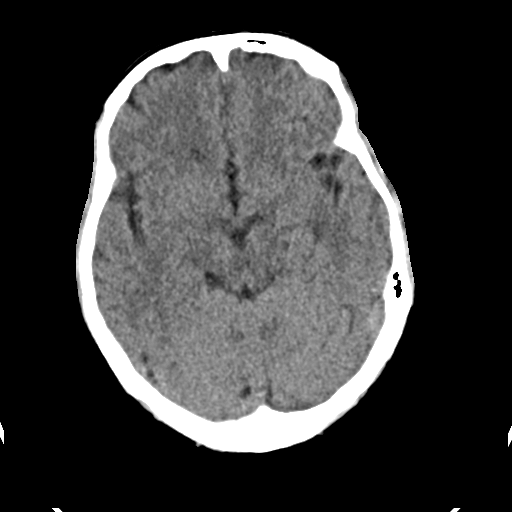
[im 13/29  brain]
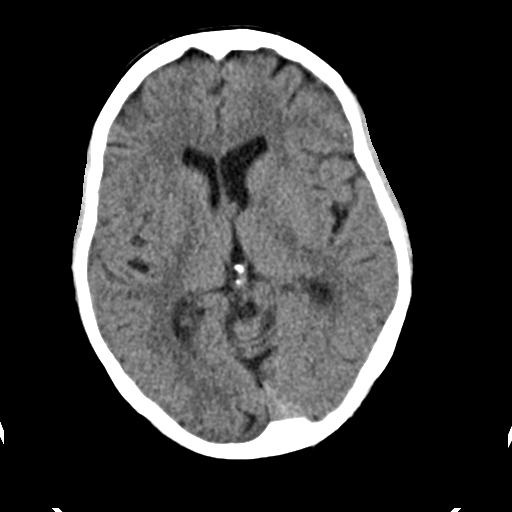
[im 17/29  brain]
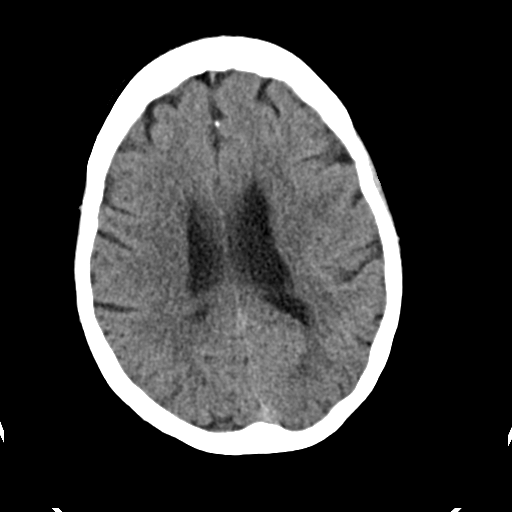
[im 17/29  bone]
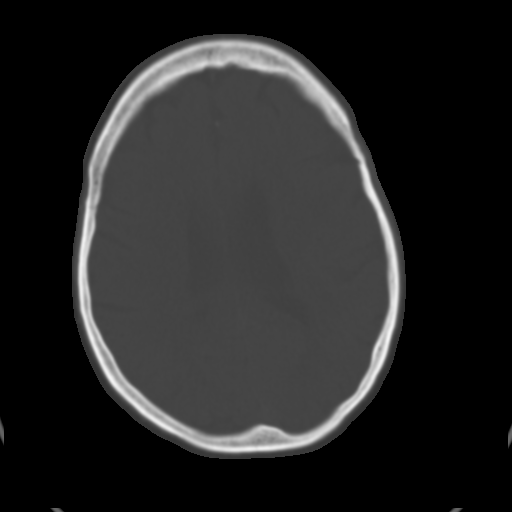
[im 20/29  brain]
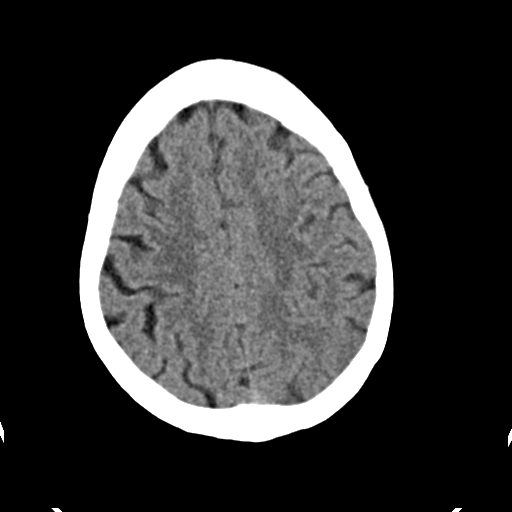
[im 23/29  brain]
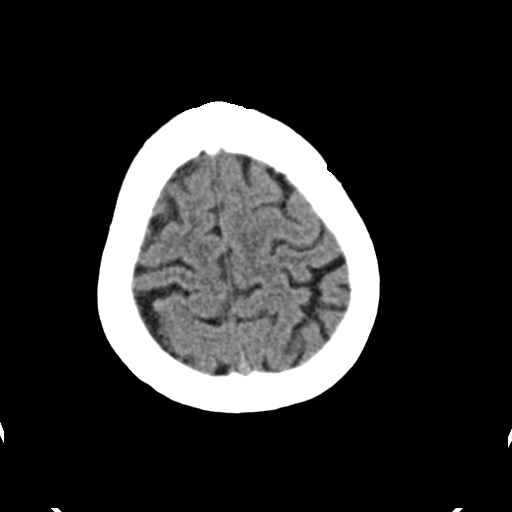
[im 27/29  brain]
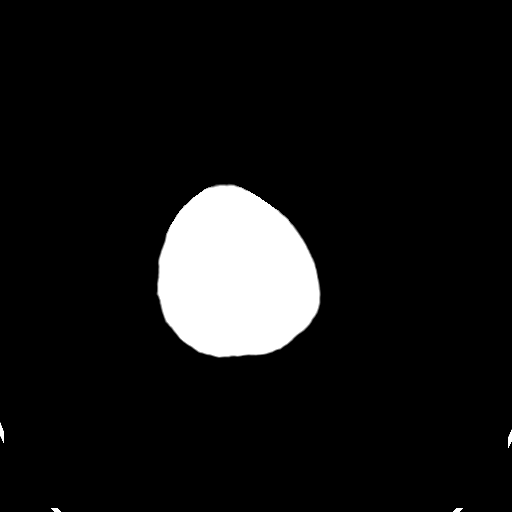

[Series 4: coronal soft · coronal · 0.29mm/px · 3 of 67 slices shown]
[im 23/67  brain]
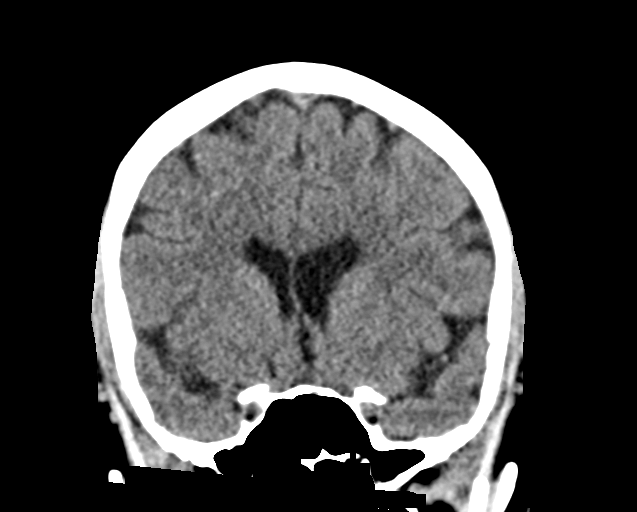
[im 30/67  brain]
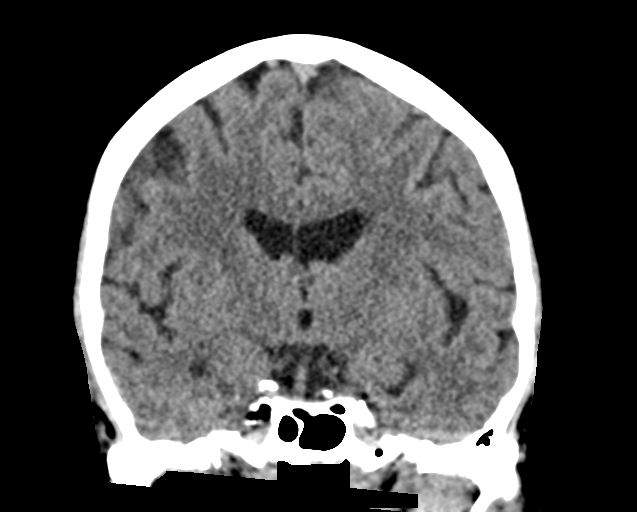
[im 37/67  brain]
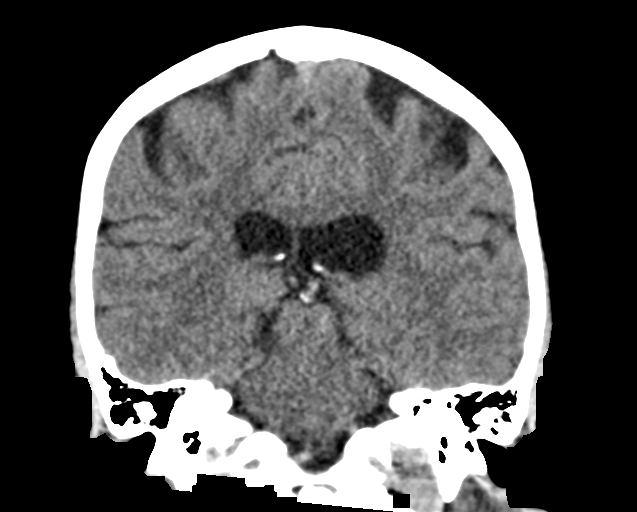

[Series 5: sag soft · sagittal · 0.29mm/px · 3 of 67 slices shown]
[im 23/67  brain]
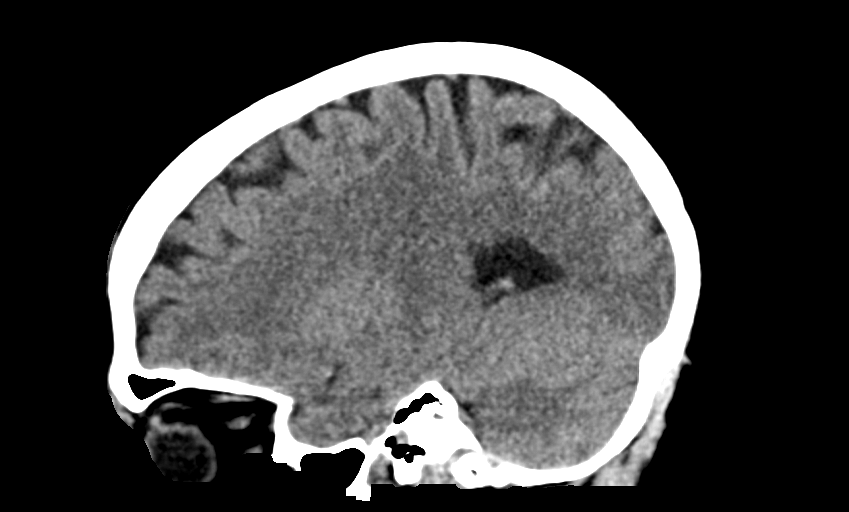
[im 34/67  brain]
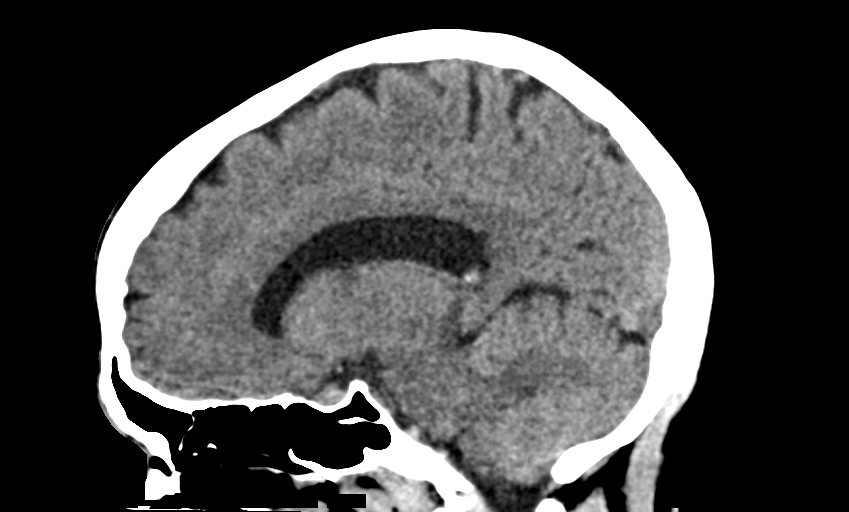
[im 45/67  brain]
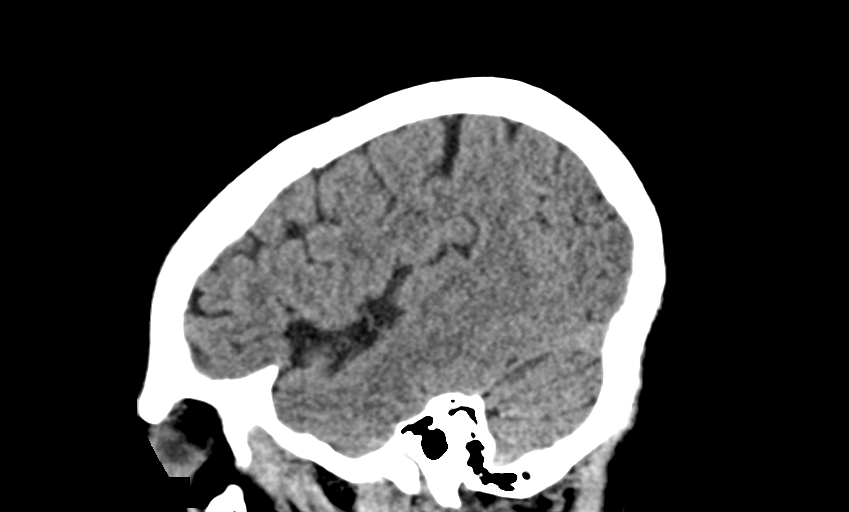

[14 of 46 positions shown; findings below may reference images not displayed]

FINDINGS: Mild generalized cerebral volume loss noted.

No acute intracranial abnormalities are identified, including mass
lesion or mass effect, hydrocephalus, extra-axial fluid collection,
midline shift, hemorrhage, or acute infarction.

The visualized bony calvarium is unremarkable.
IMPRESSION: No evidence of acute intracranial abnormality.

## 2017-06-30 ENCOUNTER — Emergency Department (HOSPITAL_BASED_OUTPATIENT_CLINIC_OR_DEPARTMENT_OTHER)
Admission: EM | Admit: 2017-06-30 | Discharge: 2017-06-30 | Disposition: A | Discharge status: Home / Self Care | Payer: Medicaid Other | Attending: Emergency Medicine | Admitting: Emergency Medicine

## 2017-06-30 ENCOUNTER — Encounter (HOSPITAL_BASED_OUTPATIENT_CLINIC_OR_DEPARTMENT_OTHER): Payer: Self-pay | Admitting: Emergency Medicine

## 2017-06-30 DIAGNOSIS — I10 Essential (primary) hypertension: Secondary | ICD-10-CM | POA: Diagnosis present

## 2017-06-30 DIAGNOSIS — Z79899 Other long term (current) drug therapy: Secondary | ICD-10-CM | POA: Diagnosis not present

## 2017-06-30 DIAGNOSIS — Z760 Encounter for issue of repeat prescription: Secondary | ICD-10-CM | POA: Diagnosis not present

## 2017-06-30 DIAGNOSIS — F17200 Nicotine dependence, unspecified, uncomplicated: Secondary | ICD-10-CM | POA: Insufficient documentation

## 2017-06-30 MED ORDER — LISINOPRIL-HYDROCHLOROTHIAZIDE 20-12.5 MG PO TABS: 1.0000 | ORAL_TABLET | Freq: Every day | ORAL | 0 refills | Status: AC

## 2017-06-30 NOTE — ED Provider Notes (Signed)
[facility] EMERGENCY DEPARTMENT Provider Note  CSN: [id number] Arrival date & time: 06/30/17 1452  Chief Complaint(s) Medication Refill  HPI Misty Contreras is a 61 y.o. female with a history of hypertension on lisinopril who presents to the emergency department for medication refill.  Patient has no acute complaints at this time.  States that she is from [place] and here in [place] visiting her ill mother.  She will be here for 2 more weeks and only has 1 more pill of her lisinopril/HCTZ.   HPI  Past Medical History Past Medical History:  Diagnosis Date  . Hypertension    There are no active problems to display for this patient.  Home Medication(s) Prior to Admission medications   Medication Sig Start Date End Date Taking? Authorizing Provider  cetirizine (ZYRTEC ALLERGY) 10 MG tablet Take 1 tablet (10 mg total) by mouth daily. 05/01/16   Everlene Farrier, PA-C  fluticasone (FLONASE) 50 MCG/ACT nasal spray Place 2 sprays into both nostrils daily. 05/01/16   Everlene Farrier, PA-C  hydrochlorothiazide (HYDRODIURIL) 25 MG tablet Take 1 tablet (25 mg total) by mouth daily. 04/18/16   Arby Barrette, MD  lisinopril (PRINIVIL,ZESTRIL) 40 MG tablet Take 1 tablet (40 mg total) by mouth daily. 04/18/16   Arby Barrette, MD  lisinopril-hydrochlorothiazide (PRINZIDE,ZESTORETIC) 20-12.5 MG tablet Take 1 tablet by mouth daily.    [provider]  metroNIDAZOLE (FLAGYL) 500 MG tablet Take 1 tablet (500 mg total) by mouth 2 (two) times daily. 05/01/16   Everlene Farrier, PA-C                                                                                                                                    Past Surgical History Past Surgical History:  Procedure Laterality Date  . UTERINE FIBROID EMBOLIZATION     Family History No family history on file.  Social History Social History  Substance Use Topics  . Smoking status: Current Every Day Smoker  . Smokeless tobacco:  Never Used  . Alcohol use Yes     Comment: weekly   Allergies Patient has no known allergies.  Review of Systems Review of Systems  Constitutional: Negative for chills, diaphoresis and fever.  Respiratory: Negative for shortness of breath.   Cardiovascular: Negative for chest pain and leg swelling.  Gastrointestinal: Negative for abdominal pain and nausea.    Physical Exam Vital Signs  I have reviewed the triage vital signs BP (!) 142/69 (BP Location: Left Arm)   Pulse 81   Temp 97.9 F (36.6 C) (Oral)   Resp 18   SpO2 100%   Physical Exam  Constitutional: She is oriented to person, place, and time. She appears well-developed and well-nourished. No distress.  HENT:  Head: Normocephalic and atraumatic.  Right Ear: External ear normal.  Left Ear: External ear normal.  Nose: Nose normal.  Eyes: Conjunctivae and EOM are normal. No scleral icterus.  Neck: Normal  range of motion and phonation normal.  Cardiovascular: Normal rate and regular rhythm.   Pulmonary/Chest: Effort normal. No stridor. No respiratory distress.  Abdominal: She exhibits no distension.  Musculoskeletal: Normal range of motion. She exhibits no edema.  Neurological: She is alert and oriented to person, place, and time.  Skin: She is not diaphoretic.  Psychiatric: She has a normal mood and affect. Her behavior is normal.  Vitals reviewed.   ED Results and Treatments Labs (all labs ordered are listed, but only abnormal results are displayed) Labs Reviewed - No data to display                                                                                                                       EKG  EKG Interpretation  Date/Time:    Ventricular Rate:    PR Interval:    QRS Duration:   QT Interval:    QTC Calculation:   R Axis:     Text Interpretation:        Radiology No results found. Pertinent labs & imaging results that were available during my care of the patient were reviewed by me and  considered in my medical decision making (see chart for details).  Medications Ordered in ED Medications - No data to display                                                                                                                                  Procedures Procedures  (including critical care time)  Medical Decision Making / ED Course I have reviewed the nursing notes for this encounter and the patient's prior records (if available in EHR or on provided paperwork).    Lisinopril/HCTZ dose verified.  Provided with 30-day prescription.  No need for labs or other diagnostic studies.The patient is safe for discharge with strict return precautions.   Final Clinical Impression(s) / ED Diagnoses Final diagnoses:  None    Disposition: Discharge  Condition: Good  Discharge instructions discussed at great length. The patient was given strict return precautions who verbalized understanding of the instructions. No further questions at time of discharge.    Current Discharge Medication List      Follow Up: Primary care provider      This chart was dictated using voice recognition software.  Despite best efforts to proofread,  errors can occur which can change the documentation meaning.   Cardama, Amadeo Garnet,  MD 06/30/17 1516

## 2017-06-30 NOTE — ED Triage Notes (Signed)
PT presents wanting a refill on her lisinopril. [place] and is in town visiting family and states she tried to call her doctor for refill and was told he no longer works there. PT has one pill left.

## 2020-03-17 ENCOUNTER — Encounter (HOSPITAL_BASED_OUTPATIENT_CLINIC_OR_DEPARTMENT_OTHER): Payer: Self-pay | Admitting: Emergency Medicine

## 2020-03-17 ENCOUNTER — Emergency Department (HOSPITAL_BASED_OUTPATIENT_CLINIC_OR_DEPARTMENT_OTHER): Payer: Medicaid Other

## 2020-03-17 ENCOUNTER — Other Ambulatory Visit: Payer: Self-pay

## 2020-03-17 ENCOUNTER — Emergency Department (HOSPITAL_BASED_OUTPATIENT_CLINIC_OR_DEPARTMENT_OTHER)
Admission: EM | Admit: 2020-03-17 | Discharge: 2020-03-17 | Disposition: A | Discharge status: Home / Self Care | Payer: Medicaid Other | Attending: Emergency Medicine | Admitting: Emergency Medicine

## 2020-03-17 DIAGNOSIS — I10 Essential (primary) hypertension: Secondary | ICD-10-CM | POA: Diagnosis not present

## 2020-03-17 DIAGNOSIS — F17210 Nicotine dependence, cigarettes, uncomplicated: Secondary | ICD-10-CM | POA: Diagnosis not present

## 2020-03-17 DIAGNOSIS — K5900 Constipation, unspecified: Secondary | ICD-10-CM | POA: Diagnosis not present

## 2020-03-17 DIAGNOSIS — R531 Weakness: Secondary | ICD-10-CM | POA: Diagnosis present

## 2020-03-17 DIAGNOSIS — Z79899 Other long term (current) drug therapy: Secondary | ICD-10-CM | POA: Diagnosis not present

## 2020-03-17 LAB — CBC WITH DIFFERENTIAL/PLATELET
Abs Immature Granulocytes: 0 10*3/uL (ref 0.00–0.07)
Basophils Absolute: 0 10*3/uL (ref 0.0–0.1)
Basophils Relative: 1 %
Eosinophils Absolute: 0 10*3/uL (ref 0.0–0.5)
Eosinophils Relative: 1 %
HCT: 43.6 % (ref 36.0–46.0)
Hemoglobin: 15.1 g/dL — ABNORMAL HIGH (ref 12.0–15.0)
Immature Granulocytes: 0 %
Lymphocytes Relative: 46 %
Lymphs Abs: 2.7 10*3/uL (ref 0.7–4.0)
MCH: 36.1 pg — ABNORMAL HIGH (ref 26.0–34.0)
MCHC: 34.6 g/dL (ref 30.0–36.0)
MCV: 104.3 fL — ABNORMAL HIGH (ref 80.0–100.0)
Monocytes Absolute: 0.5 10*3/uL (ref 0.1–1.0)
Monocytes Relative: 9 %
Neutro Abs: 2.5 10*3/uL (ref 1.7–7.7)
Neutrophils Relative %: 43 %
Platelets: 237 10*3/uL (ref 150–400)
RBC: 4.18 MIL/uL (ref 3.87–5.11)
RDW: 12.5 % (ref 11.5–15.5)
WBC: 5.8 10*3/uL (ref 4.0–10.5)
nRBC: 0 % (ref 0.0–0.2)

## 2020-03-17 LAB — HEPATIC FUNCTION PANEL
ALT: 20 U/L (ref 0–44)
AST: 21 U/L (ref 15–41)
Albumin: 4.2 g/dL (ref 3.5–5.0)
Alkaline Phosphatase: 50 U/L (ref 38–126)
Bilirubin, Direct: 0.1 mg/dL (ref 0.0–0.2)
Indirect Bilirubin: 0.4 mg/dL (ref 0.3–0.9)
Total Bilirubin: 0.5 mg/dL (ref 0.3–1.2)
Total Protein: 6.8 g/dL (ref 6.5–8.1)

## 2020-03-17 LAB — URINALYSIS, ROUTINE W REFLEX MICROSCOPIC
Bilirubin Urine: NEGATIVE
Glucose, UA: NEGATIVE mg/dL
Ketones, ur: NEGATIVE mg/dL
Nitrite: NEGATIVE
Protein, ur: NEGATIVE mg/dL
Specific Gravity, Urine: 1.01 (ref 1.005–1.030)
pH: 7 (ref 5.0–8.0)

## 2020-03-17 LAB — URINALYSIS, MICROSCOPIC (REFLEX)

## 2020-03-17 LAB — BASIC METABOLIC PANEL
Anion gap: 10 (ref 5–15)
BUN: 10 mg/dL (ref 8–23)
CO2: 26 mmol/L (ref 22–32)
Calcium: 9.4 mg/dL (ref 8.9–10.3)
Chloride: 100 mmol/L (ref 98–111)
Creatinine, Ser: 0.59 mg/dL (ref 0.44–1.00)
GFR calc Af Amer: >60 mL/min (ref >60)
GFR calc non Af Amer: >60 mL/min (ref >60)
Glucose, Bld: 93 mg/dL (ref 70–99)
Potassium: 3.5 mmol/L (ref 3.5–5.1)
Sodium: 136 mmol/L (ref 135–145)

## 2020-03-17 LAB — LIPASE, BLOOD: Lipase: 31 U/L (ref 11–51)

## 2020-03-17 MED ORDER — POLYETHYLENE GLYCOL 3350 17 G PO PACK: 17.0000 g | PACK | Freq: Every day | ORAL | 0 refills | Status: AC

## 2020-03-17 MED ORDER — SODIUM CHLORIDE 0.9 % IV BOLUS
1000.0000 mL | Freq: Once | INTRAVENOUS | Status: AC
  Administered 2020-03-17: 1000 mL via INTRAVENOUS

## 2020-03-17 NOTE — ED Provider Notes (Signed)
[facility] EMERGENCY DEPARTMENT Provider Note   CSN: [id number] Arrival date & time: 03/17/20  1657     History Chief Complaint  Patient presents with  . Weakness    Misty Contreras is a 64 y.o. female.  The history is provided by the patient.  Weakness Severity:  Mild Onset quality:  Gradual Timing:  Intermittent Progression:  Waxing and waning Chronicity:  New Context comment:  Patient with general weakness and weight loss, doctor in [place], low energy. No chest pain or SOB. Some constipation. Bug bite last week but no ticks. Relieved by:  Nothing Worsened by:  Nothing Associated symptoms: myalgias   Associated symptoms: no abdominal pain, no arthralgias, no chest pain, no cough, no diarrhea, no dysuria, no fever, no nausea, no seizures, no shortness of breath and no vomiting   Risk factors comment:  HTN      Past Medical History:  Diagnosis Date  . Hypertension     There are no problems to display for this patient.   Past Surgical History:  Procedure Laterality Date  . UTERINE FIBROID EMBOLIZATION       OB History   No obstetric history on file.     No family history on file.  Social History   Tobacco Use  . Smoking status: Current Every Day Smoker  . Smokeless tobacco: Never Used  Substance Use Topics  . Alcohol use: Yes    Comment: weekly  . Drug use: No    Home Medications Prior to Admission medications   Medication Sig Start Date End Date Taking? Authorizing Provider  cetirizine (ZYRTEC ALLERGY) 10 MG tablet Take 1 tablet (10 mg total) by mouth daily. 05/01/16   Everlene Farrier, PA-C  fluticasone (FLONASE) 50 MCG/ACT nasal spray Place 2 sprays into both nostrils daily. 05/01/16   Everlene Farrier, PA-C  hydrochlorothiazide (HYDRODIURIL) 25 MG tablet Take 1 tablet (25 mg total) by mouth daily. 04/18/16   Arby Barrette, MD  lisinopril (PRINIVIL,ZESTRIL) 40 MG tablet Take 1 tablet (40 mg total) by mouth daily. 04/18/16   Arby Barrette, MD  lisinopril-hydrochlorothiazide (PRINZIDE,ZESTORETIC) 20-12.5 MG tablet Take 1 tablet by mouth daily. 06/30/17 07/30/17  Nira Conn, MD  metroNIDAZOLE (FLAGYL) 500 MG tablet Take 1 tablet (500 mg total) by mouth 2 (two) times daily. 05/01/16   Everlene Farrier, PA-C  polyethylene glycol (MIRALAX / GLYCOLAX) 17 g packet Take 17 g by mouth daily. 03/17/20   Virgina Norfolk, DO    Allergies    Patient has no known allergies.  Review of Systems   Review of Systems  Constitutional: Positive for appetite change. Negative for chills and fever.  HENT: Negative for ear pain and sore throat.   Eyes: Negative for pain and visual disturbance.  Respiratory: Negative for cough and shortness of breath.   Cardiovascular: Negative for chest pain and palpitations.  Gastrointestinal: Positive for constipation. Negative for abdominal pain, diarrhea, nausea, rectal pain and vomiting.  Genitourinary: Negative for dysuria and hematuria.  Musculoskeletal: Positive for myalgias. Negative for arthralgias and back pain.  Skin: Negative for color change and rash.  Neurological: Positive for weakness. Negative for seizures and syncope.  All other systems reviewed and are negative.   Physical Exam Updated Vital Signs  ED Triage Vitals  Enc Vitals Group     BP 03/17/20 1705 127/87     Pulse Rate 03/17/20 1705 63     Resp 03/17/20 1705 16     Temp 03/17/20 1705 97.8 F (36.6  C)     Temp Source 03/17/20 1705 Oral     SpO2 03/17/20 1705 98 %     Weight 03/17/20 1703 117 lb (53.1 kg)     Height 03/17/20 1703 5\' 8"  (1.727 m)     Head Circumference --      Peak Flow --      Pain Score 03/17/20 1703 0     Pain Loc --      Pain Edu? --      Excl. in GC? --     Physical Exam Vitals and nursing note reviewed.  Constitutional:      General: She is not in acute distress.    Appearance: She is well-developed. She is not ill-appearing.  HENT:     Head: Normocephalic and atraumatic.     Nose:  Nose normal.     Mouth/Throat:     Mouth: Mucous membranes are moist.  Eyes:     Extraocular Movements: Extraocular movements intact.     Conjunctiva/sclera: Conjunctivae normal.     Pupils: Pupils are equal, round, and reactive to light.  Cardiovascular:     Rate and Rhythm: Normal rate and regular rhythm.     Pulses: Normal pulses.     Heart sounds: Normal heart sounds. No murmur heard.   Pulmonary:     Effort: Pulmonary effort is normal. No respiratory distress.     Breath sounds: Normal breath sounds.  Abdominal:     Palpations: Abdomen is soft.     Tenderness: There is no abdominal tenderness.  Musculoskeletal:     Cervical back: Normal range of motion and neck supple.  Skin:    General: Skin is warm and dry.     Capillary Refill: Capillary refill takes less than 2 seconds.  Neurological:     General: No focal deficit present.     Mental Status: She is alert.  Psychiatric:        Mood and Affect: Mood normal.     ED Results / Procedures / Treatments   Labs (all labs ordered are listed, but only abnormal results are displayed) Labs Reviewed  CBC WITH DIFFERENTIAL/PLATELET - Abnormal; Notable for the following components:      Result Value   Hemoglobin 15.1 (*)    MCV 104.3 (*)    MCH 36.1 (*)    All other components within normal limits  URINALYSIS, ROUTINE W REFLEX MICROSCOPIC - Abnormal; Notable for the following components:   Hgb urine dipstick MODERATE (*)    Leukocytes,Ua TRACE (*)    All other components within normal limits  URINALYSIS, MICROSCOPIC (REFLEX) - Abnormal; Notable for the following components:   Bacteria, UA RARE (*)    All other components within normal limits  URINE CULTURE  BASIC METABOLIC PANEL  HEPATIC FUNCTION PANEL  LIPASE, BLOOD    EKG None  Radiology DG Abdomen 1 View  Result Date: 03/17/2020 CLINICAL DATA:  64 year old female with constipation. EXAM: ABDOMEN - 1 VIEW COMPARISON:  CT abdomen pelvis report dated 08/12/2013.  FINDINGS: There is moderate stool in the distal colon. No bowel dilatation or evidence of obstruction. No free air or radiopaque calculi. Amorphous partially calcified mass in the pelvis. No acute osseous pathology. IMPRESSION: Moderate colonic stool burden. No bowel obstruction. Electronically Signed   By: 14/11/2012 M.D.   On: 03/17/2020 18:26    Procedures Procedures (including critical care time)  Medications Ordered in ED Medications  sodium chloride 0.9 % bolus 1,000 mL (1,000 mLs  Intravenous New Bag/Given 03/17/20 1801)    ED Course  I have reviewed the triage vital signs and the nursing notes.  Pertinent labs & imaging results that were available during my care of the patient were reviewed by me and considered in my medical decision making (see chart for details).    MDM Rules/Calculators/A&P                          Hadlei Stitt is a 64 year old female with history of hypertension who presents to the ED with generalized weakness.  Normal vitals.  No fever.  Symptoms for the last several weeks.  Has had weight loss over the past year.  Primary care doctors are in [place].  Overall she appears well.  She has some constipation.  Will check basic labs and KUB.  We will look for any electrolyte abnormalities.  No concern for any acute cardiac or pulmonary process.  Will evaluate for any signs of dehydration.  Lab work unremarkable.  No significant anemia, electrolyte abnormality, kidney injury.  Urinalysis not consistent with infection.  No symptoms.  Will send culture.  Doubt UTI.  KUB shows some mild constipation.  Will start MiraLAX.  Discharged in good condition.  Given return precautions.  This chart was dictated using voice recognition software.  Despite best efforts to proofread,  errors can occur which can change the documentation meaning.    Final Clinical Impression(s) / ED Diagnoses Final diagnoses:  Constipation, unspecified constipation type    Rx / DC  Orders ED Discharge Orders         Ordered    polyethylene glycol (MIRALAX / GLYCOLAX) 17 g packet  Daily     Discontinue  Reprint     03/17/20 1840           Virgina Norfolk, DO 03/17/20 1841

## 2020-03-17 NOTE — ED Notes (Signed)
Patient transported to X-ray 

## 2020-03-17 NOTE — ED Triage Notes (Signed)
Pt reports nausea, constipation, weakness, joint pain x 1 week.

## 2020-03-17 NOTE — ED Notes (Signed)
ED Provider at bedside. 

## 2020-03-20 LAB — URINE CULTURE

## 2021-03-17 IMAGING — CR DG ABDOMEN 1V
1 series · 1 of 1 positions shown · non-contrast
Comparison: CT abdomen pelvis report dated 08/12/2013.

CLINICAL DATA: 64-year-old female with constipation.

EXAM:
ABDOMEN - 1 VIEW

[t abdomen supine]
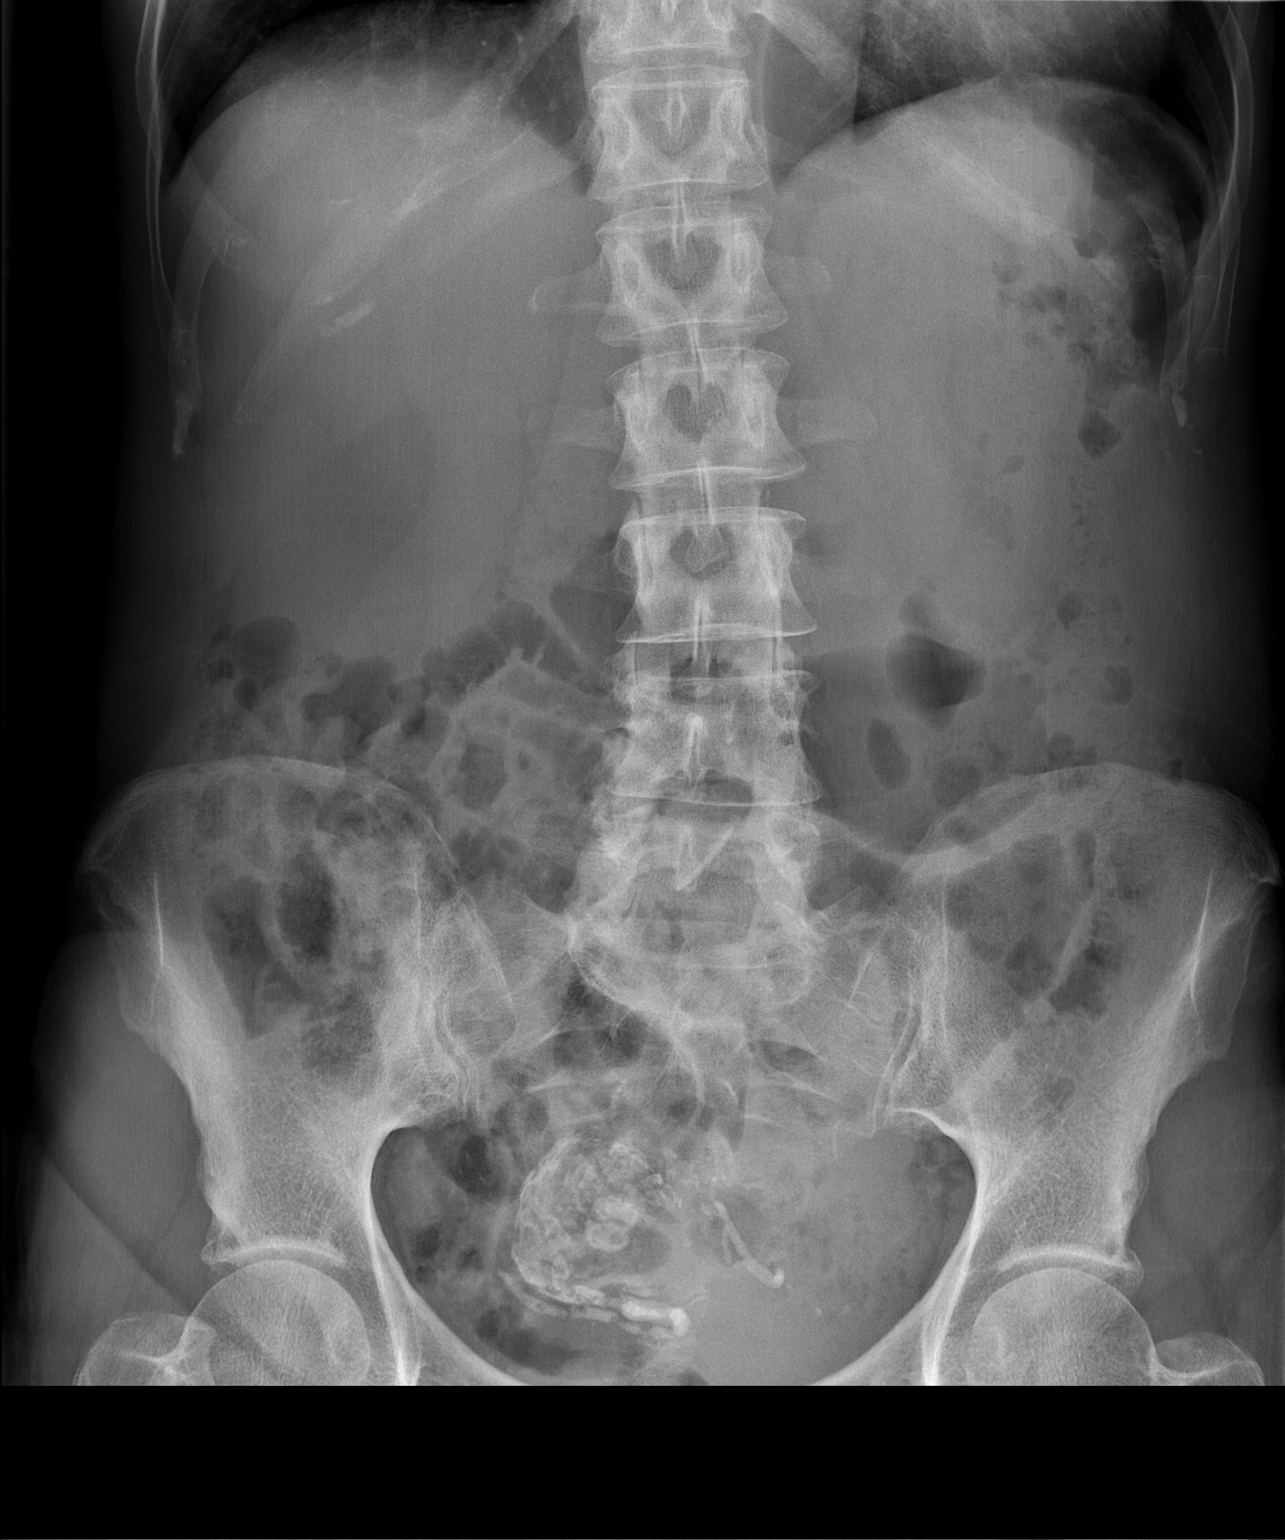

[1 of 1 positions shown; findings below may reference images not displayed]

FINDINGS: There is moderate stool in the distal colon. No bowel dilatation or
evidence of obstruction. No free air or radiopaque calculi.
Amorphous partially calcified mass in the pelvis. No acute osseous
pathology.
IMPRESSION: Moderate colonic stool burden. No bowel obstruction.
